# Patient Record
Sex: Male | Born: 1955 | Race: White | Hispanic: No | Marital: Single | Smoking: Never smoker
Health system: Southern US, Community
[De-identification: ages and names within clinical notes are randomized; demographics above are authoritative.]

## PROBLEM LIST (undated history)

## (undated) DIAGNOSIS — I10 Essential (primary) hypertension: Secondary | ICD-10-CM

## (undated) HISTORY — PX: NO PAST SURGERIES: SHX2092

---

## 2020-05-11 ENCOUNTER — Ambulatory Visit: Payer: Self-pay

## 2020-05-11 ENCOUNTER — Encounter: Payer: Self-pay | Admitting: Emergency Medicine

## 2020-05-11 ENCOUNTER — Ambulatory Visit
Admission: EM | Admit: 2020-05-11 | Discharge: 2020-05-11 | Disposition: A | Discharge status: Home / Self Care | Payer: BC Managed Care – PPO | Attending: Family Medicine | Admitting: Family Medicine

## 2020-05-11 ENCOUNTER — Other Ambulatory Visit: Payer: Self-pay

## 2020-05-11 ENCOUNTER — Ambulatory Visit (INDEPENDENT_AMBULATORY_CARE_PROVIDER_SITE_OTHER): Payer: BC Managed Care – PPO

## 2020-05-11 DIAGNOSIS — M25571 Pain in right ankle and joints of right foot: Secondary | ICD-10-CM

## 2020-05-11 HISTORY — DX: Essential (primary) hypertension: I10

## 2020-05-11 MED ORDER — PREDNISONE 10 MG PO TABS: ORAL_TABLET | ORAL | 0 refills | Status: AC

## 2020-05-11 NOTE — ED Triage Notes (Signed)
Pt c/o right ankle pain. Started last night. He denies known injury. He states it is swollen and mild redness. He states it kept him up last night but is better today.

## 2020-05-11 NOTE — ED Provider Notes (Signed)
[facility] ____________________________________________  Time seen: Approximately 1:34 PM  I have reviewed the triage vital signs and the nursing notes.   HISTORY  Chief Complaint Ankle Pain (right)   HPI Lissandro Dilorenzo is a 64 y.o. male presenting for evaluation of right lateral ankle pain.  Patient reports pain onset was last night and disrupted his sleep, but reports at this time feeling somewhat better.  States he may have rolled his ankle on Monday while walking around at work because he remembered stepping on a step awkwardly, but reports no abrupt onset of pain.  Denies fall, other injury.  Denies break in skin, redness, drainage.  Denies pain radiation, paresthesias.  Denies history of gout.  Denies fevers.  Reports otherwise doing well.  Denies history of the same.  Denies alleviating measures.  Pain worse with activity.   Past Medical History:  Diagnosis Date   Hypertension     There are no problems to display for this patient.   Past Surgical History:  Procedure Laterality Date   NO PAST SURGERIES       No current facility-administered medications for this encounter.  Current Outpatient Medications:    esomeprazole (NEXIUM) 40 MG capsule, Take by mouth., Disp: , Rfl:    lisinopril-hydrochlorothiazide (ZESTORETIC) 20-25 MG tablet, Take 1 tablet by mouth daily., Disp: , Rfl:    sildenafil (REVATIO) 20 MG tablet, Take by mouth., Disp: , Rfl:    simvastatin (ZOCOR) 40 MG tablet, Take 40 mg by mouth at bedtime., Disp: , Rfl:    TRULANCE 3 MG TABS, TAKE 1 TABLET (3 MG TOTAL) BY MOUTH ONCE DAILY, Disp: , Rfl:    predniSONE (DELTASONE) 10 MG tablet, Start 60 mg po day one, then 50 mg po day two, taper by 10 mg daily until complete., Disp: 21 tablet, Rfl: 0  Allergies Patient has no known allergies.  History reviewed. No pertinent family history.  Social History Social History   Tobacco Use   Smoking status: Never Smoker   Smokeless tobacco:  Never Used  Building services engineer Use: Never used  Substance Use Topics   Alcohol use: Yes   Drug use: Not Currently    Review of Systems Constitutional: No fever Cardiovascular: Denies chest pain. Respiratory: Denies shortness of breath. Gastrointestinal: No abdominal pain.  No nausea, no vomiting.  No diarrhea.   Genitourinary: Negative for dysuria. Musculoskeletal: Positive right ankle pain. Skin: Negative for rash.   ____________________________________________   PHYSICAL EXAM:  VITAL SIGNS: ED Triage Vitals  Enc Vitals Group     BP 05/11/20 1315 125/79     Pulse Rate 05/11/20 1315 76     Resp 05/11/20 1315 18     Temp 05/11/20 1315 98.2 F (36.8 C)     Temp Source 05/11/20 1315 Oral     SpO2 05/11/20 1315 98 %     Weight 05/11/20 1311 190 lb (86.2 kg)     Height 05/11/20 1311 6\' 3"  (1.905 m)     Head Circumference --      Peak Flow --      Pain Score 05/11/20 1310 3     Pain Loc --      Pain Edu? --      Excl. in GC? --     Constitutional: Alert and oriented. Well appearing and in no acute distress. Eyes: Conjunctivae are normal.  ENT      Head: Normocephalic and atraumatic. Cardiovascular:  Good peripheral circulation. Respiratory: Normal respiratory effort  without tachypnea nor retractions.  Musculoskeletal: Steady gait.  Distal dorsalis pedis and posterior last pulses intact is a palpated. Except: Right lateral ankle mild to moderate tenderness direct palpation with mild localized swelling, no erythema, no point bony tenderness, mild decreased rotation, good plantarflexion and dorsiflexion, right lower extremity otherwise nontender.  Right foot distal sensation and capillary refill intact. Neurologic:  Normal speech and language.  Skin:  Skin is warm, dry and intact. No rash noted. Psychiatric: Mood and affect are normal. Speech and behavior are normal. Patient exhibits appropriate insight and judgment   ___________________________________________     LABS (all labs ordered are listed, but only abnormal results are displayed)  Labs Reviewed - No data to display ____________________________________________  RADIOLOGY  DG Ankle Complete Right  Result Date: 05/11/2020 CLINICAL DATA:  RIGHT ankle pain and swelling since yesterday at lateral malleolus, no known injury EXAM: RIGHT ANKLE - COMPLETE 3+ VIEW COMPARISON:  None FINDINGS: Osseous mineralization normal for technique. Joint spaces preserved. Spurring at medial joint line. Lateral soft tissue swelling. No acute fracture, dislocation, or bone destruction. Tiny plantar calcaneal spur. Small vessel vascular calcifications at ankle into foot. IMPRESSION: Degenerative changes at medial joint line. No acute osseous abnormalities. Electronically Signed   By: Ulyses Southward M.D.   On: 05/11/2020 14:13   ____________________________________________   PROCEDURES Procedures  ____________________________________   INITIAL IMPRESSION / ASSESSMENT AND PLAN / ED COURSE  Pertinent labs & imaging results that were available during my care of the patient were reviewed by me and considered in my medical decision making (see chart for details).  Well-appearing patient.  No acute distress.  Acute onset of right ankle pain.  Question rolling his ankle 2 days ago, however suspect acute gout.  Right ankle x-ray as above, do no changes, no acute osseous abnormalities.  Will treat with oral prednisone.  ASO release of splint given for support.  Gradual increase activity as tolerated.  Work note given for today and tomorrow.  Ice, elevation and monitoring.  Gout information given.Discussed indication, risks and benefits of medications with patient.   Discussed follow up with Primary care physician this week. Discussed follow up and return parameters including no resolution or any worsening concerns. Patient verbalized understanding and agreed to plan.   ____________________________________________   FINAL  CLINICAL IMPRESSION(S) / ED DIAGNOSES  Final diagnoses:  Acute right ankle pain     ED Discharge Orders         Ordered    predniSONE (DELTASONE) 10 MG tablet     Discontinue  Reprint     05/11/20 1428           Note: This dictation was prepared with Dragon dictation along with smaller phrase technology. Any transcriptional errors that result from this process are unintentional.         Renford Dills, NP 05/11/20 1456

## 2020-05-11 NOTE — Discharge Instructions (Signed)
Take medication as prescribed. Rest. Drink plenty of fluids. Elevate.  ° °Follow up with your primary care physician this week as needed. Return to Urgent care for new or worsening concerns.  ° °

## 2021-05-14 IMAGING — CR DG ANKLE COMPLETE 3+V*R*
3 series · 3 of 3 positions shown · non-contrast
Comparison: None

CLINICAL DATA: RIGHT ankle pain and swelling since yesterday at
lateral malleolus, no known injury

EXAM:
RIGHT ANKLE - COMPLETE 3+ VIEW

[ankle ap]
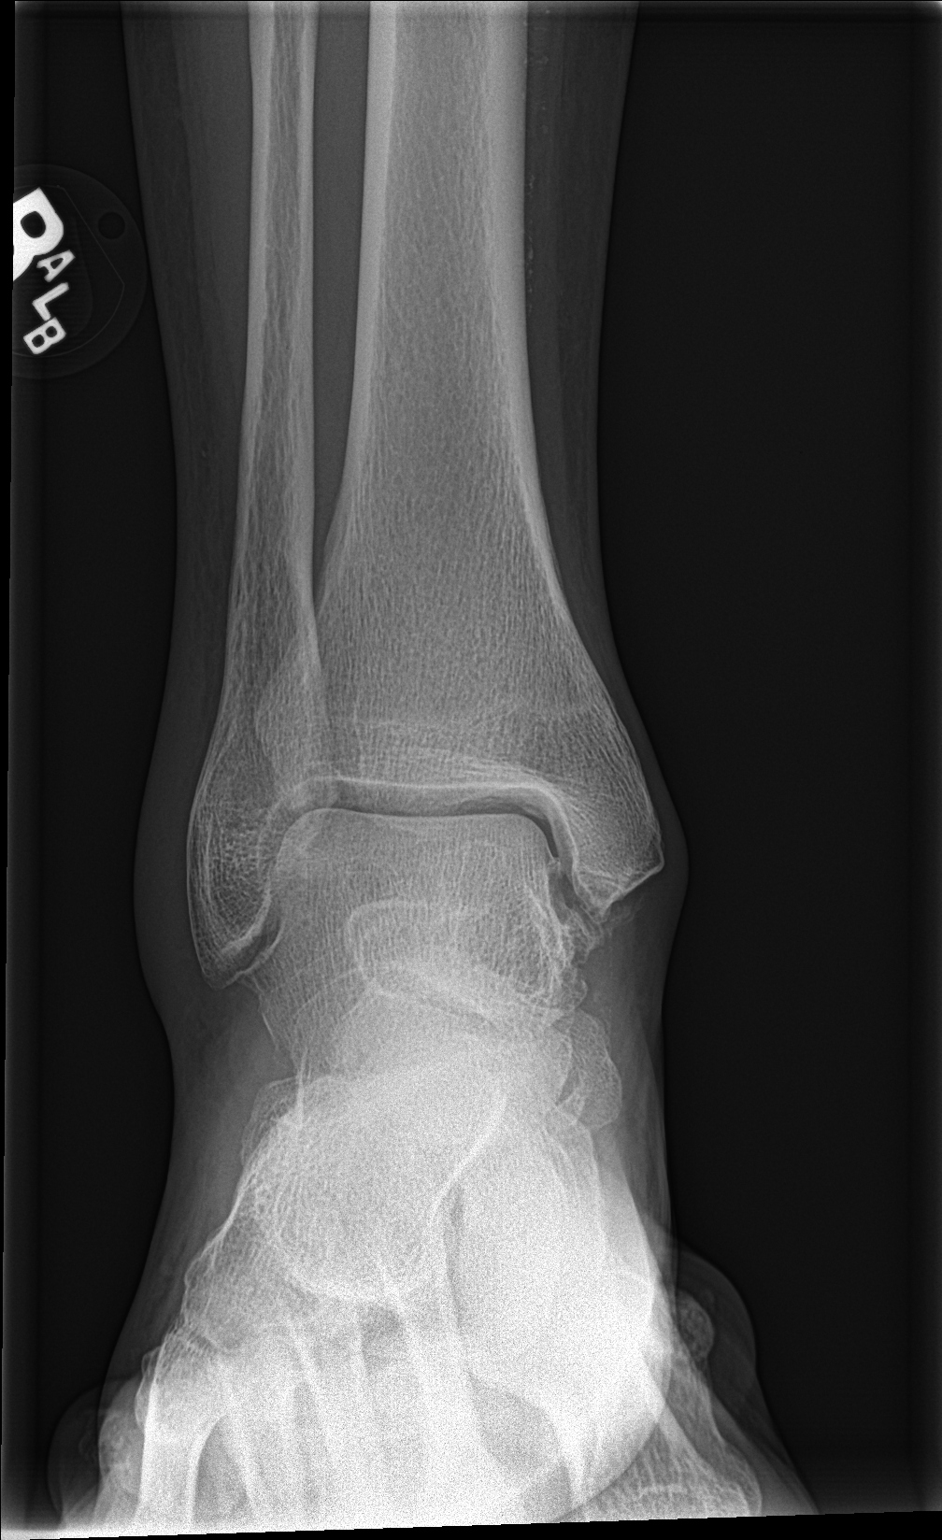

[ankle obl]
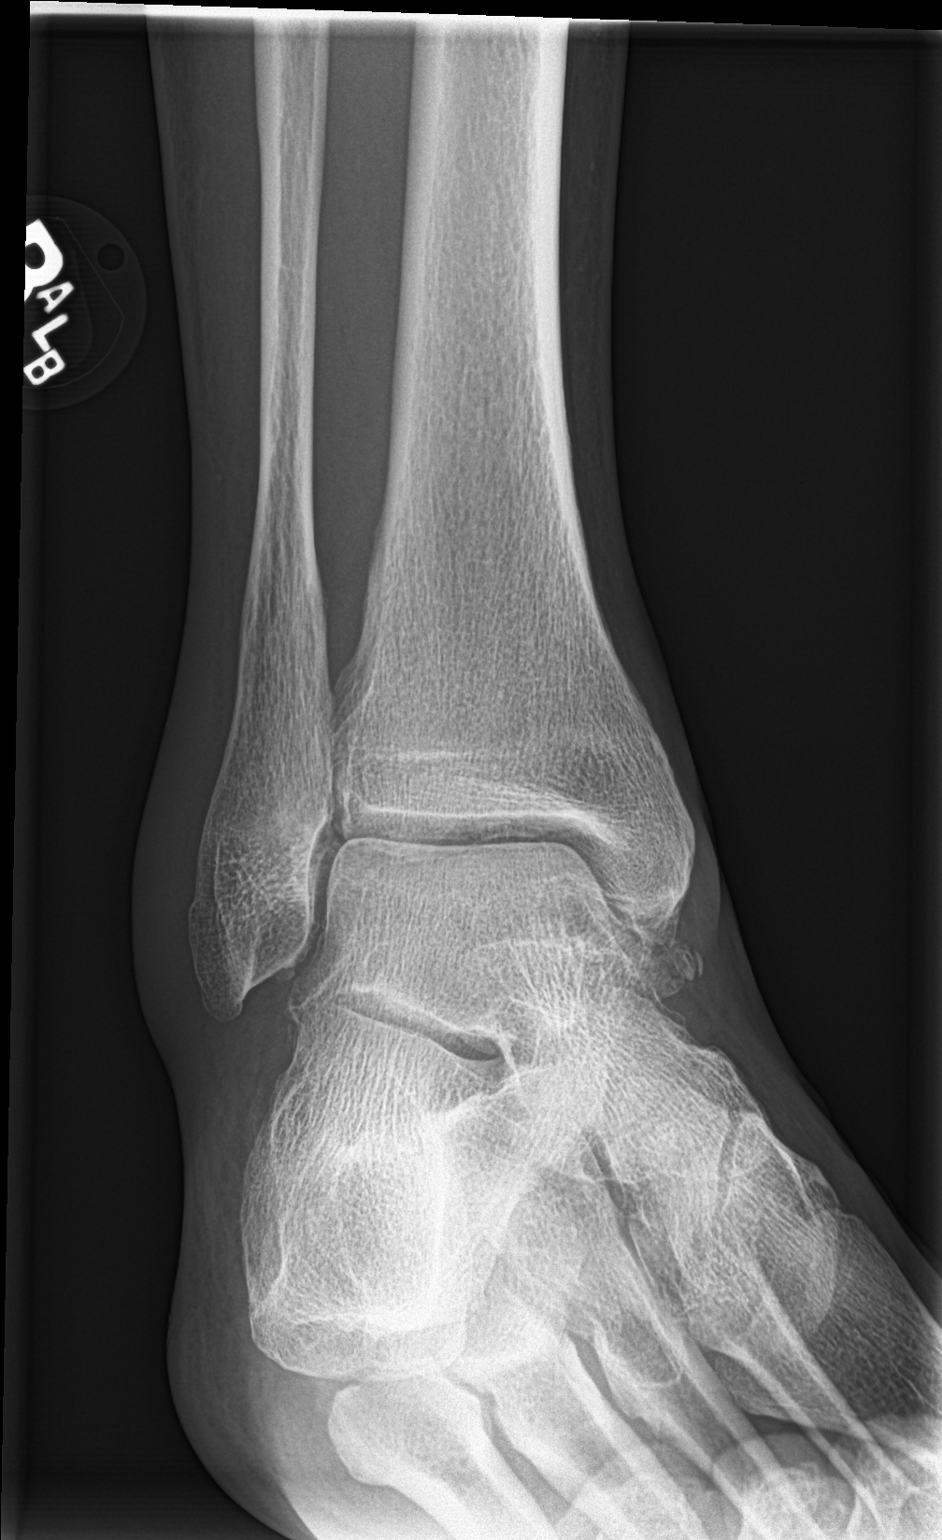

[ankle lat]
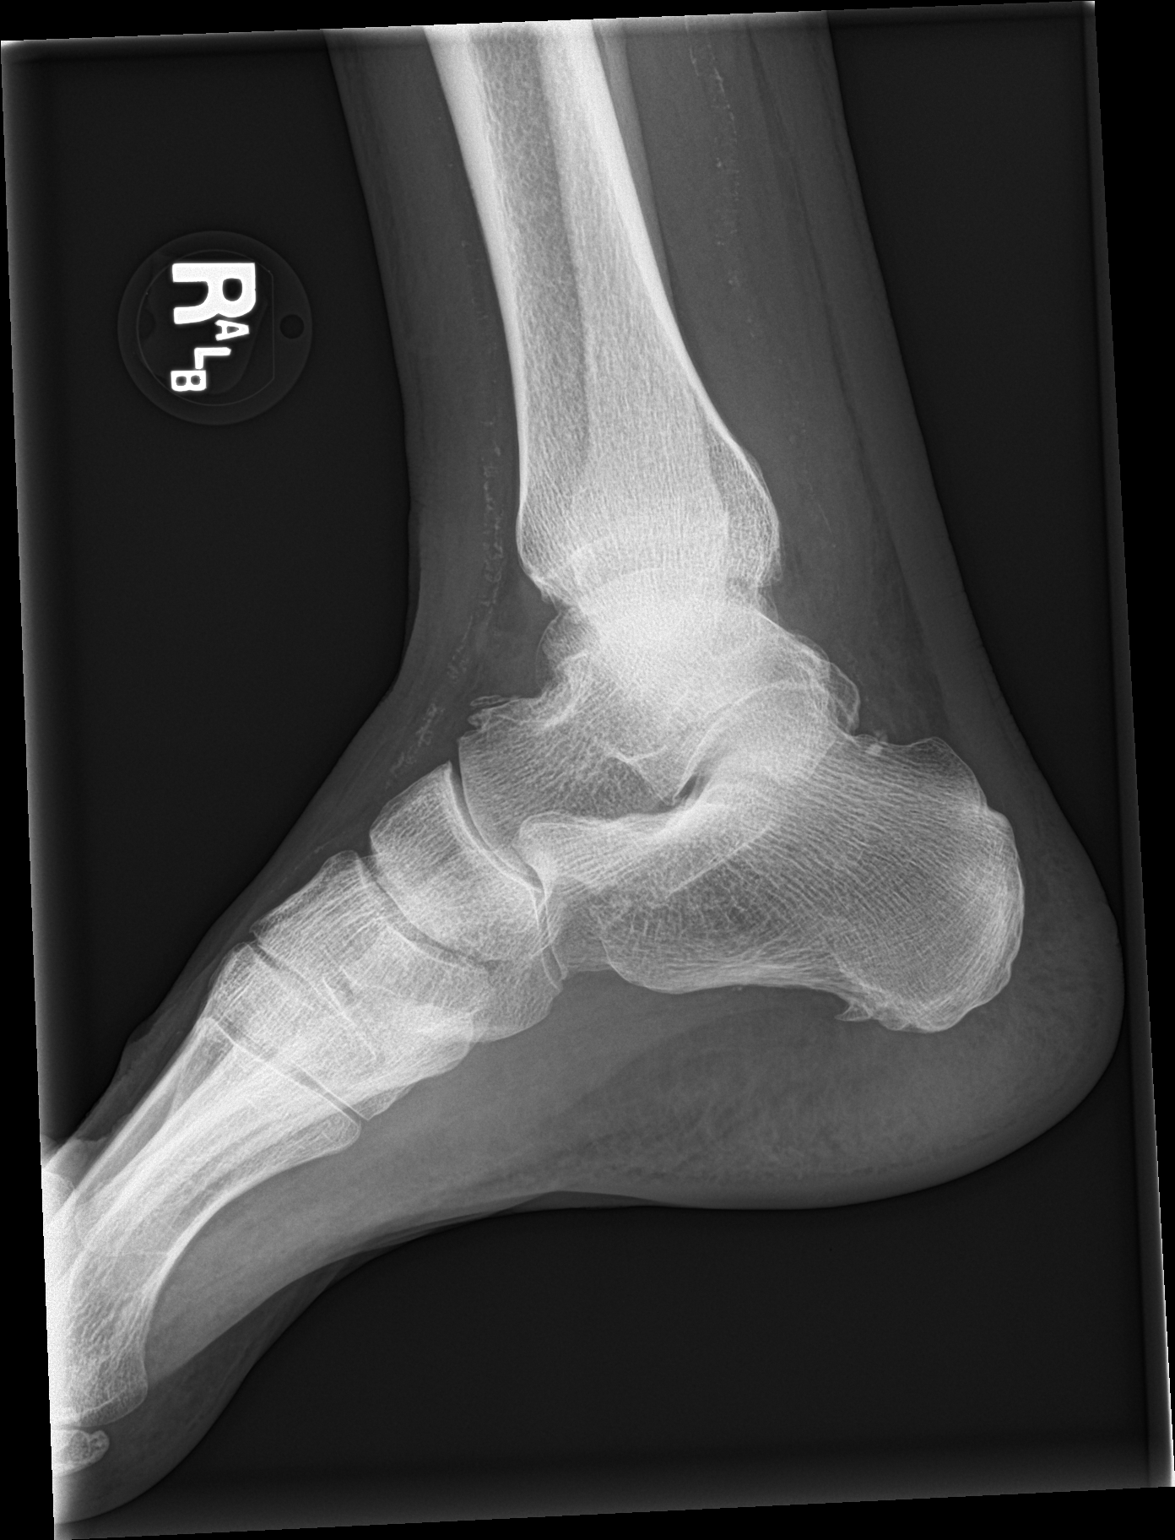

[3 of 3 positions shown; findings below may reference images not displayed]

FINDINGS: Osseous mineralization normal for technique.

Joint spaces preserved.

Spurring at medial joint line.

Lateral soft tissue swelling.

No acute fracture, dislocation, or bone destruction.

Tiny plantar calcaneal spur.

Small vessel vascular calcifications at ankle into foot.
IMPRESSION: Degenerative changes at medial joint line.

No acute osseous abnormalities.

## 2023-08-08 ENCOUNTER — Ambulatory Visit: Payer: Self-pay

## 2024-10-19 ENCOUNTER — Other Ambulatory Visit: Payer: Self-pay

## 2024-10-19 DIAGNOSIS — E0520 Thyrotoxicosis with toxic multinodular goiter without thyrotoxic crisis or storm: Secondary | ICD-10-CM

## 2024-10-19 DIAGNOSIS — E042 Nontoxic multinodular goiter: Secondary | ICD-10-CM
